# Patient Record
Sex: Male | Born: 1975 | Race: White | Hispanic: No | Marital: Single | Smoking: Former smoker
Health system: Southern US, Community
[De-identification: ages and names within clinical notes are randomized; demographics above are authoritative.]

---

## 2016-03-27 ENCOUNTER — Emergency Department (HOSPITAL_COMMUNITY): Payer: Managed Care, Other (non HMO)

## 2016-03-27 ENCOUNTER — Encounter (HOSPITAL_COMMUNITY): Payer: Self-pay | Admitting: *Deleted

## 2016-03-27 ENCOUNTER — Emergency Department (HOSPITAL_COMMUNITY)
Admission: EM | Admit: 2016-03-27 | Discharge: 2016-03-28 | Disposition: A | Discharge status: Home / Self Care | Payer: Managed Care, Other (non HMO) | Attending: Emergency Medicine | Admitting: Emergency Medicine

## 2016-03-27 DIAGNOSIS — Z87891 Personal history of nicotine dependence: Secondary | ICD-10-CM | POA: Insufficient documentation

## 2016-03-27 DIAGNOSIS — R079 Chest pain, unspecified: Secondary | ICD-10-CM | POA: Diagnosis not present

## 2016-03-27 LAB — BASIC METABOLIC PANEL
Anion gap: 6 (ref 5–15)
BUN: 13 mg/dL (ref 6–20)
CALCIUM: 9.4 mg/dL (ref 8.9–10.3)
CO2: 27 mmol/L (ref 22–32)
CREATININE: 0.85 mg/dL (ref 0.61–1.24)
Chloride: 104 mmol/L (ref 101–111)
GFR calc Af Amer: >60 mL/min (ref >60)
GLUCOSE: 120 mg/dL — AB (ref 65–99)
Potassium: 4.1 mmol/L (ref 3.5–5.1)
SODIUM: 137 mmol/L (ref 135–145)

## 2016-03-27 LAB — CBC
HEMATOCRIT: 41.9 % (ref 39.0–52.0)
Hemoglobin: 13.8 g/dL (ref 13.0–17.0)
MCH: 27.9 pg (ref 26.0–34.0)
MCHC: 32.9 g/dL (ref 30.0–36.0)
MCV: 84.8 fL (ref 78.0–100.0)
PLATELETS: 256 10*3/uL (ref 150–400)
RBC: 4.94 MIL/uL (ref 4.22–5.81)
RDW: 13.4 % (ref 11.5–15.5)
WBC: 9.2 10*3/uL (ref 4.0–10.5)

## 2016-03-27 LAB — TROPONIN I: Troponin I: <0.03 ng/mL (ref <0.031)

## 2016-03-27 NOTE — ED Provider Notes (Signed)
CSN: [id number]     Arrival date & time 03/27/16  2028 History   First MD Initiated Contact with Patient 03/27/16 2347     Chief Complaint  Patient presents with  . Chest Pain     HPI   Brandon Francis is an 40 y.o. male with history of pre-diabetes and HLD who presents to the ED for evaluation of chest pain. He states for the past two weeks he has had intermittent substernal "clenching" chest pain. He states the pain is becoming more frequent so he is here tonight for evaluation. He states he typically gets 1-2 episodes of chest pain daily. At times these episodes are associated with chest tightness or shortness of breath. He states the episodes last a few minutes and resolve on their own. He denies other associated symptoms including diaphoresis, nausea, vomiting, lightheadedness. He states the pain is random and can come at rest or while at work. He has not tried anything to alleviate his symptoms. He denies calf pain or leg swelling. States his maternal grandfather did have an MI at age 238 and his father has afib. Denies recent travel, h/o blood clots, h/o malignancy. Denies cough, URI symptoms, fever, chills. He states that he saw PCP for annual physical recently who referred him to a cardiologist for evaluation of his chest pain; cards appointment July 7. Quit smoking 1 year ago; previously smoked 1 PPD x 15 yrs.  History reviewed. No pertinent past medical history. History reviewed. No pertinent past surgical history. No family history on file. Social History  Substance Use Topics  . Smoking status: Former Games developermoker  . Smokeless tobacco: None  . Alcohol Use: Yes    Review of Systems  All other systems reviewed and are negative.     Allergies  Review of patient's allergies indicates no known allergies.  Home Medications   Prior to Admission medications   Not on File   BP 117/72 mmHg  Pulse 62  Temp(Src) 97.8 F (36.6 C)  Resp 20  SpO2 97% Physical Exam  Constitutional:  He is oriented to person, place, and time.  HENT:  Right Ear: External ear normal.  Left Ear: External ear normal.  Nose: Nose normal.  Mouth/Throat: Oropharynx is clear and moist. No oropharyngeal exudate.  Eyes: Conjunctivae and EOM are normal. Pupils are equal, round, and reactive to light.  Neck: Normal range of motion. Neck supple.  Cardiovascular: Normal rate, regular rhythm, normal heart sounds and intact distal pulses.   Pulmonary/Chest: Effort normal and breath sounds normal. No respiratory distress. He has no wheezes. He exhibits no tenderness.  Abdominal: Soft. Bowel sounds are normal. He exhibits no distension. There is no tenderness. There is no rebound and no guarding.  Musculoskeletal: He exhibits no edema.  No LE edema No calf tenderness  Neurological: He is alert and oriented to person, place, and time. No cranial nerve deficit.  Skin: Skin is warm and dry.  Psychiatric: He has a normal mood and affect.  Nursing note and vitals reviewed.   ED Course  Procedures (including critical care time) Labs Review Labs Reviewed  BASIC METABOLIC PANEL - Abnormal; Notable for the following:    Glucose, Bld 120 (*)    All other components within normal limits  CBC  TROPONIN I    Imaging Review Dg Chest 2 View  03/27/2016  CLINICAL DATA:  Central left-sided chest pain for 1-2 weeks. EXAM: CHEST  2 VIEW COMPARISON:  None. FINDINGS: Mild peribronchial thickening is seen.  No consolidative process, pneumothorax or effusion. No focal bony abnormality. IMPRESSION: Bronchitic change without focal process. Electronically Signed   By: Drusilla Kanner M.D.   On: 03/27/2016 21:11   I have personally reviewed and evaluated these images and lab results as part of my medical decision-making.   EKG Interpretation   Date/Time:  Wednesday March 27 2016 20:33:08 EDT Ventricular Rate:  77 PR Interval:  136 QRS Duration: 90 QT Interval:  374 QTC Calculation: 423 R Axis:   13 Text  Interpretation:  Normal sinus rhythm Normal ECG No previous ECGs  available Confirmed by LITTLE MD, RACHEL [phone]) on 03/27/2016 11:47:58 PM      MDM   Final diagnoses:  Chest pain, unspecified chest pain type    Brandon Francis is an 40 y.o. male presenting for evaluation of two weeks of intermittent chest pain of increasing frequency. His workup in the ED is unremarkable. EKG NSR trop negative, CXR negative. His exam is nonfocal. Doubt ACS. PERC 0. Doubt PE. I did discuss with him several etiologies of chest pain including stress/anxiety--pt admits to high stress job as well as recent stress about his health. He has a cardiology appointment scheduled which I advised him to keep and move up if possible as I do think he would benefit from cardiology consultation given his family history and his risk factors. However, no indication for further emergent workup tonight. Pt is nontoxic appearing and hemodynamically stable. Will d/c home with ER return precautions. Pt is in agreement with this plan.    Carlene Coria, PA-C 03/28/16 0014  Laurence Spates, MD 03/29/16 2258

## 2016-03-27 NOTE — ED Notes (Signed)
The pt is c/o central to lt chest pain for 1-2 weeks with some sob.  No pain at present

## 2016-03-28 NOTE — ED Notes (Signed)
Pt stable, ambulatory, states understanding of discharge instructions 

## 2016-03-28 NOTE — Discharge Instructions (Signed)
Please follow up with your cardiologist as scheduled. Please also follow up with your primary care provider. Return to the emergency room for new or worsening symptoms.   Nonspecific Chest Pain  Chest pain can be caused by many different conditions. There is always a chance that your pain could be related to something serious, such as a heart attack or a blood clot in your lungs. Chest pain can also be caused by conditions that are not life-threatening. If you have chest pain, it is very important to follow up with your health care provider. CAUSES  Chest pain can be caused by:  Heartburn.  Pneumonia or bronchitis.  Anxiety or stress.  Inflammation around your heart (pericarditis) or lung (pleuritis or pleurisy).  A blood clot in your lung.  A collapsed lung (pneumothorax). It can develop suddenly on its own (spontaneous pneumothorax) or from trauma to the chest.  Shingles infection (varicella-zoster virus).  Heart attack.  Damage to the bones, muscles, and cartilage that make up your chest wall. This can include:  Bruised bones due to injury.  Strained muscles or cartilage due to frequent or repeated coughing or overwork.  Fracture to one or more ribs.  Sore cartilage due to inflammation (costochondritis). RISK FACTORS  Risk factors for chest pain may include:  Activities that increase your risk for trauma or injury to your chest.  Respiratory infections or conditions that cause frequent coughing.  Medical conditions or overeating that can cause heartburn.  Heart disease or family history of heart disease.  Conditions or health behaviors that increase your risk of developing a blood clot.  Having had chicken pox (varicella zoster). SIGNS AND SYMPTOMS Chest pain can feel like:  Burning or tingling on the surface of your chest or deep in your chest.  Crushing, pressure, aching, or squeezing pain.  Dull or sharp pain that is worse when you move, cough, or take a  deep breath.  Pain that is also felt in your back, neck, shoulder, or arm, or pain that spreads to any of these areas. Your chest pain may come and go, or it may stay constant. DIAGNOSIS Lab tests or other studies may be needed to find the cause of your pain. Your health care provider may have you take a test called an ambulatory ECG (electrocardiogram). An ECG records your heartbeat patterns at the time the test is performed. You may also have other tests, such as:  Transthoracic echocardiogram (TTE). During echocardiography, sound waves are used to create a picture of all of the heart structures and to look at how blood flows through your heart.  Transesophageal echocardiogram (TEE).This is a more advanced imaging test that obtains images from inside your body. It allows your health care provider to see your heart in finer detail.  Cardiac monitoring. This allows your health care provider to monitor your heart rate and rhythm in real time.  Holter monitor. This is a portable device that records your heartbeat and can help to diagnose abnormal heartbeats. It allows your health care provider to track your heart activity for several days, if needed.  Stress tests. These can be done through exercise or by taking medicine that makes your heart beat more quickly.  Blood tests.  Imaging tests. TREATMENT  Your treatment depends on what is causing your chest pain. Treatment may include:  Medicines. These may include:  Acid blockers for heartburn.  Anti-inflammatory medicine.  Pain medicine for inflammatory conditions.  Antibiotic medicine, if an infection is present.  Medicines  to dissolve blood clots.  Medicines to treat coronary artery disease.  Supportive care for conditions that do not require medicines. This may include:  Resting.  Applying heat or cold packs to injured areas.  Limiting activities until pain decreases. HOME CARE INSTRUCTIONS  If you were prescribed an  antibiotic medicine, finish it all even if you start to feel better.  Avoid any activities that bring on chest pain.  Do not use any tobacco products, including cigarettes, chewing tobacco, or electronic cigarettes. If you need help quitting, ask your health care provider.  Do not drink alcohol.  Take medicines only as directed by your health care provider.  Keep all follow-up visits as directed by your health care provider. This is important. This includes any further testing if your chest pain does not go away.  If heartburn is the cause for your chest pain, you may be told to keep your head raised (elevated) while sleeping. This reduces the chance that acid will go from your stomach into your esophagus.  Make lifestyle changes as directed by your health care provider. These may include:  Getting regular exercise. Ask your health care provider to suggest some activities that are safe for you.  Eating a heart-healthy diet. A registered dietitian can help you to learn healthy eating options.  Maintaining a healthy weight.  Managing diabetes, if necessary.  Reducing stress. SEEK MEDICAL CARE IF:  Your chest pain does not go away after treatment.  You have a rash with blisters on your chest.  You have a fever. SEEK IMMEDIATE MEDICAL CARE IF:   Your chest pain is worse.  You have an increasing cough, or you cough up blood.  You have severe abdominal pain.  You have severe weakness.  You faint.  You have chills.  You have sudden, unexplained chest discomfort.  You have sudden, unexplained discomfort in your arms, back, neck, or jaw.  You have shortness of breath at any time.  You suddenly start to sweat, or your skin gets clammy.  You feel nauseous or you vomit.  You suddenly feel light-headed or dizzy.  Your heart begins to beat quickly, or it feels like it is skipping beats. These symptoms may represent a serious problem that is an emergency. Do not wait to  see if the symptoms will go away. Get medical help right away. Call your local emergency services (911 in the [place] Do not drive yourself to the hospital.   This information is not intended to replace advice given to you by your health care provider. Make sure you discuss any questions you have with your health care provider.   Document Released: 07/17/2005 Document Revised: 10/28/2014 Document Reviewed: 05/13/2014 Elsevier Interactive Patient Education [employer].

## 2016-04-08 ENCOUNTER — Ambulatory Visit (INDEPENDENT_AMBULATORY_CARE_PROVIDER_SITE_OTHER): Payer: Managed Care, Other (non HMO) | Admitting: Family Medicine

## 2016-04-08 VITALS — BP 144/90 | HR 89 | Temp 98.0°F | Resp 20 | Ht 76.0 in | Wt 281.0 lb

## 2016-04-08 DIAGNOSIS — H8112 Benign paroxysmal vertigo, left ear: Secondary | ICD-10-CM | POA: Diagnosis not present

## 2016-04-08 DIAGNOSIS — R55 Syncope and collapse: Secondary | ICD-10-CM

## 2016-04-08 DIAGNOSIS — R112 Nausea with vomiting, unspecified: Secondary | ICD-10-CM | POA: Diagnosis not present

## 2016-04-08 LAB — GLUCOSE, POCT (MANUAL RESULT ENTRY): POC Glucose: 113 mg/dl — AB (ref 70–99)

## 2016-04-08 MED ORDER — MECLIZINE HCL 25 MG PO TABS: ORAL_TABLET | ORAL | Status: AC

## 2016-04-08 MED ORDER — ONDANSETRON 4 MG PO TBDP
4.0000 mg | ORAL_TABLET | Freq: Once | ORAL | Status: AC
  Administered 2016-04-08: 4 mg via ORAL

## 2016-04-08 NOTE — Patient Instructions (Addendum)
Continue to drink plenty of fluids to stay well hydrated  Take the meclizine 25 mg one half pill 3 Times daily as needed for dizziness.  Return if worse at anytime go to the emergency room if necessary  Keep your appointment with your cardiologist  Benign Positional Vertigo Vertigo is the feeling that you or your surroundings are moving when they are not. Benign positional vertigo is the most common form of vertigo. The cause of this condition is not serious (is benign). This condition is triggered by certain movements and positions (is positional). This condition can be dangerous if it occurs while you are doing something that could endanger you or others, such as driving.  CAUSES In many cases, the cause of this condition is not known. It may be caused by a disturbance in an area of the inner ear that helps your brain to sense movement and balance. This disturbance can be caused by a viral infection (labyrinthitis), head injury, or repetitive motion. RISK FACTORS This condition is more likely to develop in:  Women.  People who are 38 years of age or older. SYMPTOMS Symptoms of this condition usually happen when you move your head or your eyes in different directions. Symptoms may start suddenly, and they usually last for less than a minute. Symptoms may include:  Loss of balance and falling.  Feeling like you are spinning or moving.  Feeling like your surroundings are spinning or moving.  Nausea and vomiting.  Blurred vision.  Dizziness.  Involuntary eye movement (nystagmus). Symptoms can be mild and cause only slight annoyance, or they can be severe and interfere with daily life. Episodes of benign positional vertigo may return (recur) over time, and they may be triggered by certain movements. Symptoms may improve over time. DIAGNOSIS This condition is usually diagnosed by medical history and a physical exam of the head, neck, and ears. You may be referred to a health care  provider who specializes in ear, nose, and throat (ENT) problems (otolaryngologist) or a provider who specializes in disorders of the nervous system (neurologist). You may have additional testing, including:  MRI.  A CT scan.  Eye movement tests. Your health care provider may ask you to change positions quickly while he or she watches you for symptoms of benign positional vertigo, such as nystagmus. Eye movement may be tested with an electronystagmogram (ENG), caloric stimulation, the Dix-Hallpike test, or the roll test.  An electroencephalogram (EEG). This records electrical activity in your brain.  Hearing tests. TREATMENT Usually, your health care provider will treat this by moving your head in specific positions to adjust your inner ear back to normal. Surgery may be needed in severe cases, but this is rare. In some cases, benign positional vertigo may resolve on its own in 2-4 weeks. HOME CARE INSTRUCTIONS Safety  Move slowly.Avoid sudden body or head movements.  Avoid driving.  Avoid operating heavy machinery.  Avoid doing any tasks that would be dangerous to you or others if a vertigo episode would occur.  If you have trouble walking or keeping your balance, try using a cane for stability. If you feel dizzy or unstable, sit down right away.  Return to your normal activities as told by your health care provider. Ask your health care provider what activities are safe for you. General Instructions  Take over-the-counter and prescription medicines only as told by your health care provider.  Avoid certain positions or movements as told by your health care provider.  Drink enough fluid  to keep your urine clear or pale yellow.  Keep all follow-up visits as told by your health care provider. This is important. SEEK MEDICAL CARE IF:  You have a fever.  Your condition gets worse or you develop new symptoms.  Your family or friends notice any behavioral changes.  Your nausea  or vomiting gets worse.  You have numbness or a "pins and needles" sensation. SEEK IMMEDIATE MEDICAL CARE IF:  You have difficulty speaking or moving.  You are always dizzy.  You faint.  You develop severe headaches.  You have weakness in your legs or arms.  You have changes in your hearing or vision.  You develop a stiff neck.  You develop sensitivity to light.   This information is not intended to replace advice given to you by your health care provider. Make sure you discuss any questions you have with your health care provider.   Document Released: 07/15/2006 Document Revised: 06/28/2015 Document Reviewed: 01/30/2015 Elsevier Interactive Patient Education [employer]  IF you received an x-ray today, you will receive an invoice from [facility]. Please contact [facility] at [phone] with questions or concerns regarding your invoice.   IF you received labwork today, you will receive an invoice from [employer]. Please contact [facility] at [phone] with questions or concerns regarding your invoice.   Our billing staff will not be able to assist you with questions regarding bills from these companies.  You will be contacted with the lab results as soon as they are available. The fastest way to get your results is to activate your My Chart account. Instructions are located on the last page of this paperwork. If you have not heard from [place] regarding the results in 2 weeks, please contact this office.

## 2016-04-08 NOTE — Progress Notes (Signed)
Patient ID: Brandon Francis, male    DOB: 07/27/1976  Age: 40 y.o. MRN: [id number]  Chief Complaint  Patient presents with  . Dizziness    vomiting and nausea started 30 mins ago     Subjective:   Patient was at work this morning and developed lightheadedness/dizziness. He felt like he might pass out. It came back again. He got nauseated. He drove himself to the office and vomited in route as well as in our parking lot and got nauseated again in the office waiting area. He had only had an apple and a banana for breakfast. He did not have any chest pain. He was in the emergency room a few weeks ago with an episode of chest pain. Workup at that time was negative. His PCP did lab work on him in April all of which looked pretty good. He showed it to me. He has had a couple of EKGs now both of which were normal. He does have an appointment with a cardiologist in a couple of weeks to evaluate these chest pains that he has been having. He quit smoking about a year ago. He is not on any regular prescription medications.  Current allergies, medications, problem list, past/family and social histories reviewed.  Objective:  BP 144/90 mmHg  Pulse 89  Temp(Src) 98 F (36.7 C) (Oral)  Resp 20  Ht 6\' 4"  (1.93 m)  Wt 281 lb (127.461 kg)  BMI 34.22 kg/m2  SpO2 98%  Pleasant gentleman, alert and oriented. TMs are normal. Eyes PERRLA. EOMs had just minimal nystagmus with left. Throat clear. Neck supple without nodes. No carotid bruits. Chest clear to auscultation. Heart regular without murmurs. Neurologic cranial nerves II-12 grossly intact. Motor symmetrical. Finger-nose normal. Heel shin normal. Romberg negative. Sensory grossly intact. No dizziness when laying down, but when he sat up he got a little dizzy again.  Assessment & Plan:   Assessment: No diagnosis found.    Plan: Will treat for vertigo. He should continue to follow-up with his cardiologist. She instructions.  No orders of the  defined types were placed in this encounter.    No orders of the defined types were placed in this encounter.   Results for orders placed or performed in visit on 04/08/16  POCT glucose (manual entry)  Result Value Ref Range   POC Glucose 113 (A) 70 - 99 mg/dl    EKG is normal   Patient Instructions       IF you received an x-ray today, you will receive an invoice from [facility]. Please contact [facility] at [phone] with questions or concerns regarding your invoice.   IF you received labwork today, you will receive an invoice from [facility]. Please contact [facility] at [phone] with questions or concerns regarding your invoice.   Our billing staff will not be able to assist you with questions regarding bills from these companies.  You will be contacted with the lab results as soon as they are available. The fastest way to get your results is to activate your My Chart account. Instructions are located on the last page of this paperwork. If you have not heard from us regarding the results in 2 weeks, please contact this office.         No Follow-up on file.   Laurenashley Viar, MD 04/08/2016

## 2018-02-02 IMAGING — CR DG CHEST 2V
2 series · 2 of 2 positions shown · non-contrast
Comparison: None.

CLINICAL DATA: Central left-sided chest pain for 1-2 weeks.

EXAM:
CHEST  2 VIEW

[chest pa]
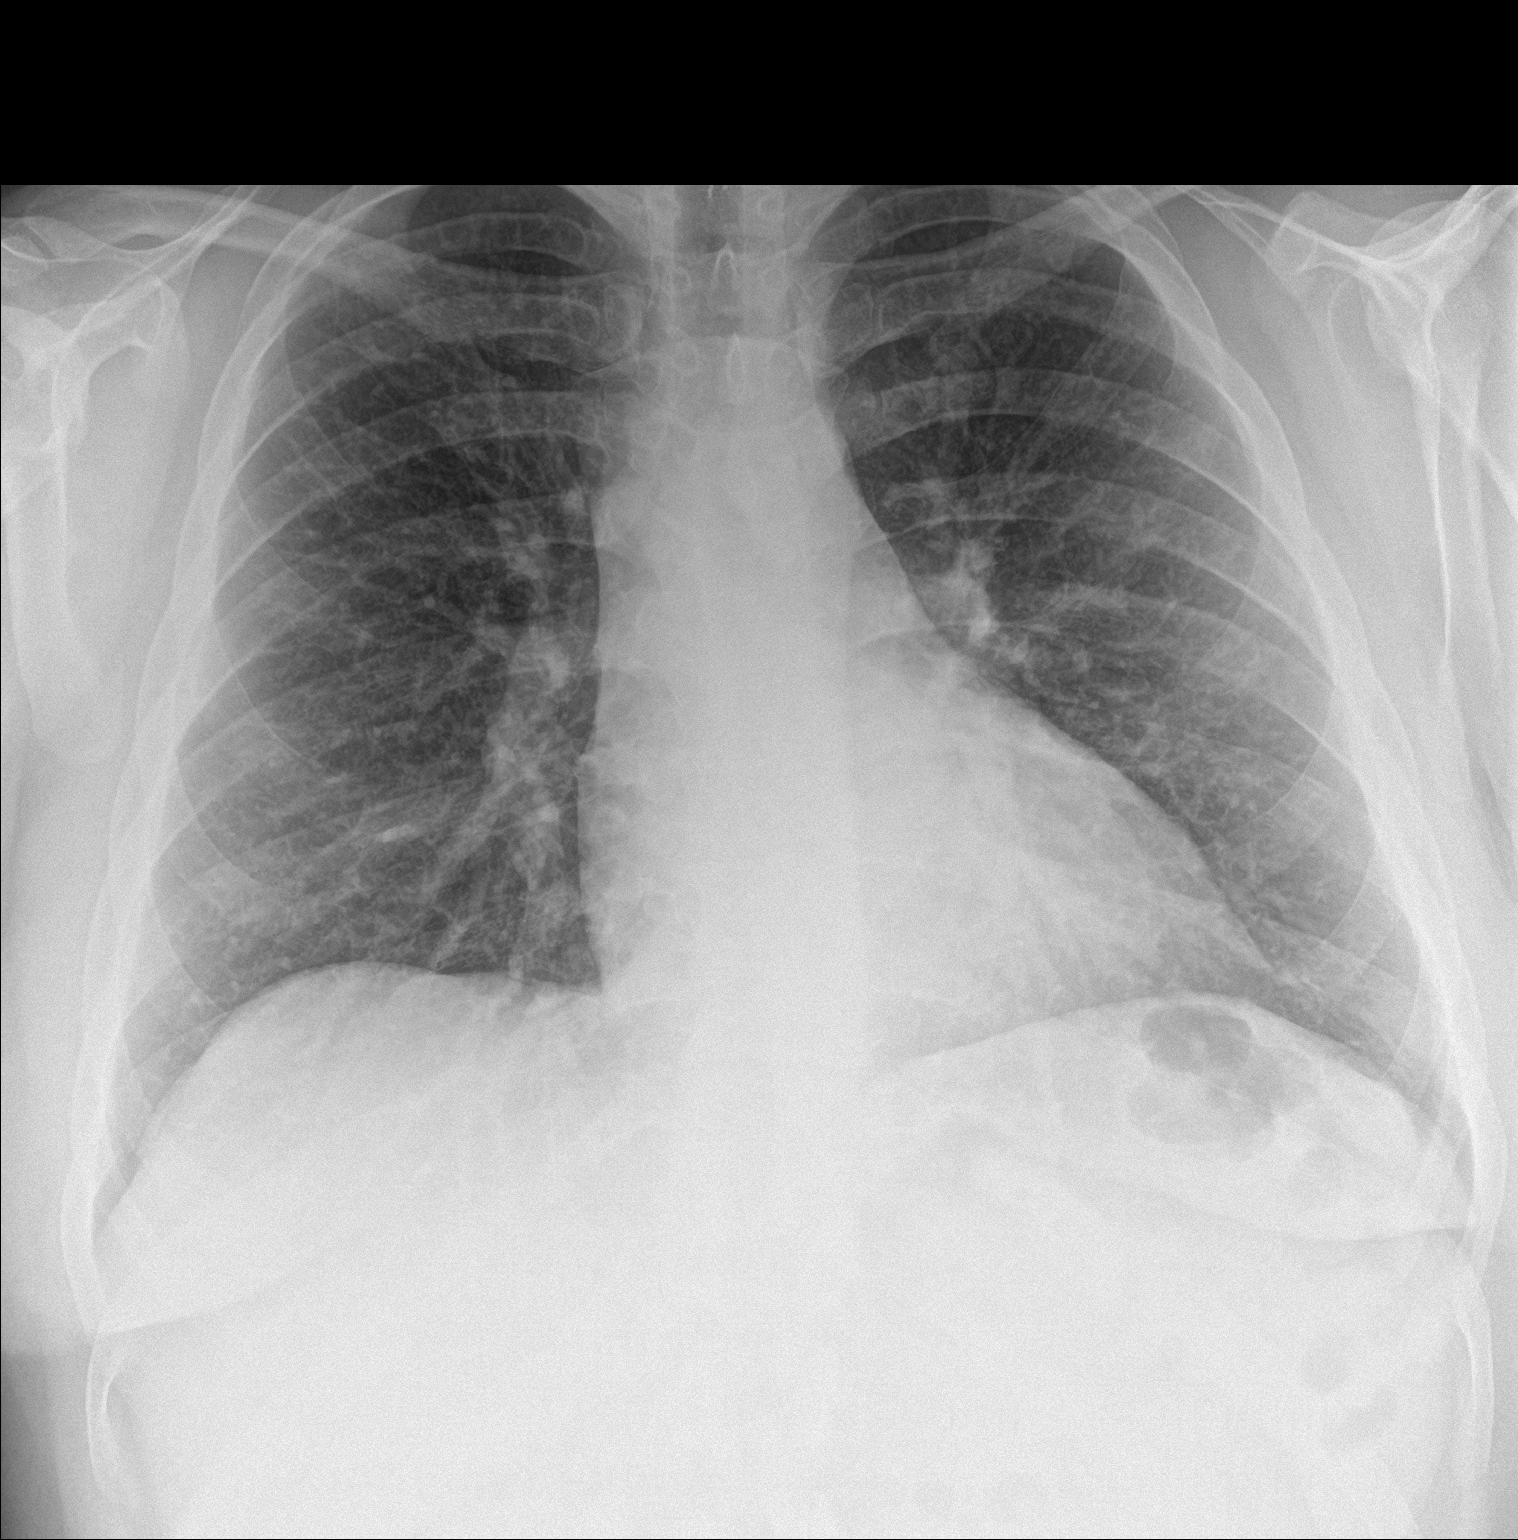

[chest lat]
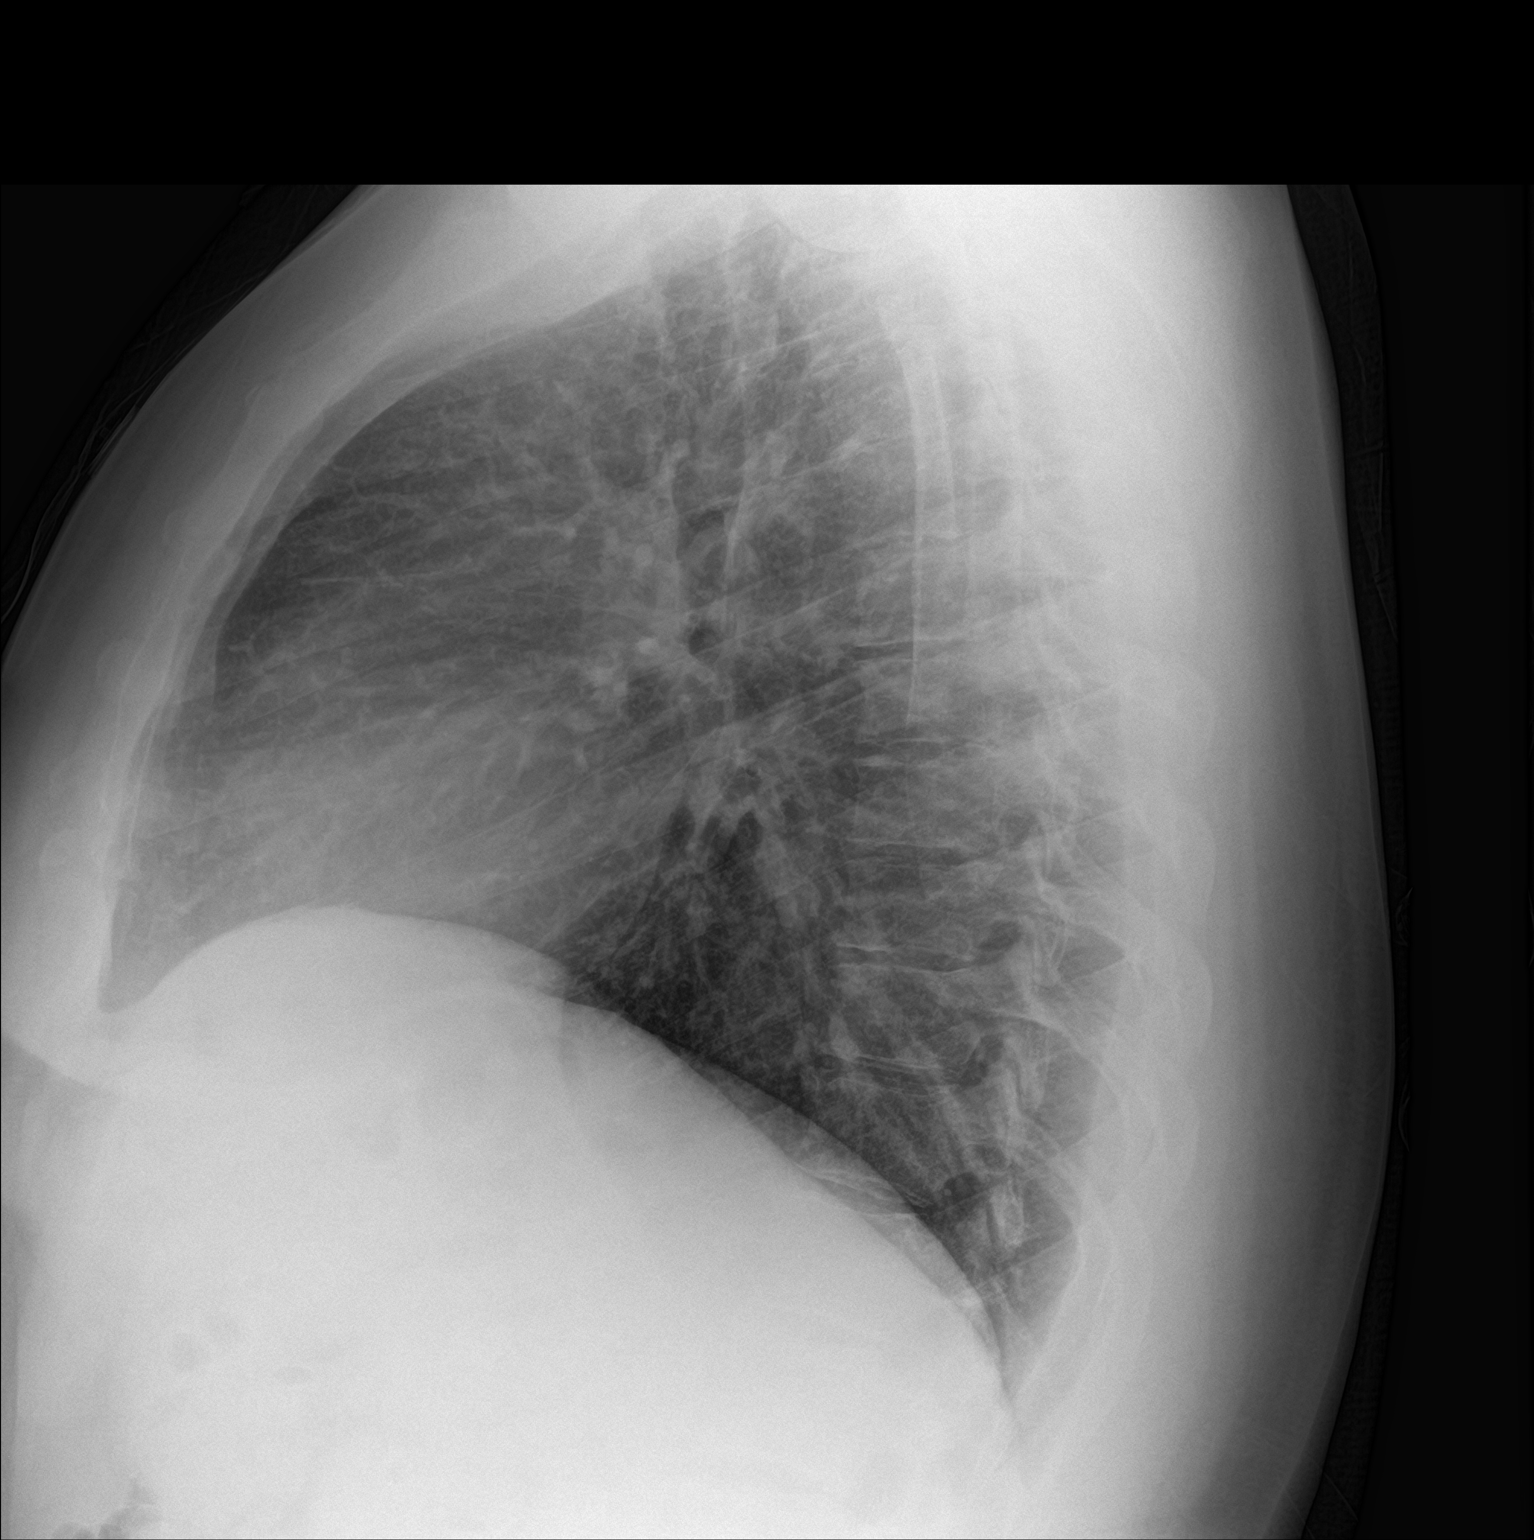

[2 of 2 positions shown; findings below may reference images not displayed]

FINDINGS: Mild peribronchial thickening is seen. No consolidative process,
pneumothorax or effusion. No focal bony abnormality.
IMPRESSION: Bronchitic change without focal process.
